# Patient Record
Sex: Female | Born: 1955 | Race: Black or African American | Hispanic: No
Health system: Southern US, Community
[De-identification: ages and names within clinical notes are randomized; demographics above are authoritative.]

---

## 1999-07-16 ENCOUNTER — Encounter: Admission: RE | Admit: 1999-07-16 | Discharge: 1999-07-16 | Payer: Self-pay | Admitting: Obstetrics and Gynecology

## 1999-07-16 ENCOUNTER — Encounter: Payer: Self-pay | Admitting: Obstetrics and Gynecology

## 2000-07-16 ENCOUNTER — Encounter: Payer: Self-pay | Admitting: Obstetrics and Gynecology

## 2000-07-16 ENCOUNTER — Encounter: Admission: RE | Admit: 2000-07-16 | Discharge: 2000-07-16 | Payer: Self-pay | Admitting: Obstetrics and Gynecology

## 2001-07-19 ENCOUNTER — Encounter: Admission: RE | Admit: 2001-07-19 | Discharge: 2001-07-19 | Payer: Self-pay | Admitting: Obstetrics and Gynecology

## 2001-07-19 ENCOUNTER — Encounter: Payer: Self-pay | Admitting: Obstetrics and Gynecology

## 2001-07-21 ENCOUNTER — Other Ambulatory Visit: Admission: RE | Admit: 2001-07-21 | Discharge: 2001-07-21 | Payer: Self-pay | Admitting: Obstetrics and Gynecology

## 2002-07-21 ENCOUNTER — Encounter: Admission: RE | Admit: 2002-07-21 | Discharge: 2002-07-21 | Payer: Self-pay | Admitting: Obstetrics and Gynecology

## 2002-07-21 ENCOUNTER — Encounter: Payer: Self-pay | Admitting: Obstetrics and Gynecology

## 2002-07-27 ENCOUNTER — Other Ambulatory Visit: Admission: RE | Admit: 2002-07-27 | Discharge: 2002-07-27 | Payer: Self-pay | Admitting: Obstetrics and Gynecology

## 2002-07-28 ENCOUNTER — Other Ambulatory Visit: Admission: RE | Admit: 2002-07-28 | Discharge: 2002-07-28 | Payer: Self-pay | Admitting: Obstetrics and Gynecology

## 2002-12-07 ENCOUNTER — Other Ambulatory Visit: Admission: RE | Admit: 2002-12-07 | Discharge: 2002-12-07 | Payer: Self-pay | Admitting: Obstetrics and Gynecology

## 2003-08-03 ENCOUNTER — Other Ambulatory Visit: Admission: RE | Admit: 2003-08-03 | Discharge: 2003-08-03 | Payer: Self-pay | Admitting: Obstetrics and Gynecology

## 2003-09-18 ENCOUNTER — Encounter: Admission: RE | Admit: 2003-09-18 | Discharge: 2003-09-18 | Payer: Self-pay | Admitting: Obstetrics and Gynecology

## 2004-02-01 ENCOUNTER — Other Ambulatory Visit: Admission: RE | Admit: 2004-02-01 | Discharge: 2004-02-01 | Payer: Self-pay | Admitting: Obstetrics and Gynecology

## 2004-08-06 ENCOUNTER — Other Ambulatory Visit: Admission: RE | Admit: 2004-08-06 | Discharge: 2004-08-06 | Payer: Self-pay | Admitting: Obstetrics and Gynecology

## 2004-09-30 ENCOUNTER — Encounter: Admission: RE | Admit: 2004-09-30 | Discharge: 2004-09-30 | Payer: Self-pay | Admitting: Obstetrics and Gynecology

## 2005-10-02 ENCOUNTER — Encounter: Admission: RE | Admit: 2005-10-02 | Discharge: 2005-10-02 | Payer: Self-pay | Admitting: Obstetrics and Gynecology

## 2005-12-02 ENCOUNTER — Encounter: Admission: RE | Admit: 2005-12-02 | Discharge: 2005-12-02 | Payer: Self-pay | Admitting: Obstetrics and Gynecology

## 2006-10-05 ENCOUNTER — Encounter: Admission: RE | Admit: 2006-10-05 | Discharge: 2006-10-05 | Payer: Self-pay | Admitting: Obstetrics and Gynecology

## 2007-10-06 ENCOUNTER — Encounter: Admission: RE | Admit: 2007-10-06 | Discharge: 2007-10-06 | Payer: Self-pay | Admitting: Obstetrics and Gynecology

## 2008-10-09 ENCOUNTER — Encounter: Admission: RE | Admit: 2008-10-09 | Discharge: 2008-10-09 | Payer: Self-pay | Admitting: Obstetrics and Gynecology

## 2009-10-15 ENCOUNTER — Encounter: Admission: RE | Admit: 2009-10-15 | Discharge: 2009-10-15 | Payer: Self-pay | Admitting: Obstetrics and Gynecology

## 2010-09-17 ENCOUNTER — Other Ambulatory Visit: Payer: Self-pay | Admitting: Obstetrics and Gynecology

## 2010-09-17 DIAGNOSIS — Z1231 Encounter for screening mammogram for malignant neoplasm of breast: Secondary | ICD-10-CM

## 2010-10-21 ENCOUNTER — Ambulatory Visit: Payer: Self-pay

## 2010-10-21 ENCOUNTER — Ambulatory Visit
Admission: RE | Admit: 2010-10-21 | Discharge: 2010-10-21 | Disposition: A | Discharge status: Home / Self Care | Payer: Managed Care, Other (non HMO) | Source: Ambulatory Visit | Attending: Obstetrics and Gynecology | Admitting: Obstetrics and Gynecology

## 2010-10-21 DIAGNOSIS — Z1231 Encounter for screening mammogram for malignant neoplasm of breast: Secondary | ICD-10-CM

## 2011-09-18 ENCOUNTER — Other Ambulatory Visit: Payer: Self-pay | Admitting: Obstetrics and Gynecology

## 2011-09-18 DIAGNOSIS — Z1231 Encounter for screening mammogram for malignant neoplasm of breast: Secondary | ICD-10-CM

## 2011-10-23 ENCOUNTER — Ambulatory Visit
Admission: RE | Admit: 2011-10-23 | Discharge: 2011-10-23 | Disposition: A | Discharge status: Home / Self Care | Payer: Managed Care, Other (non HMO) | Source: Ambulatory Visit | Attending: Obstetrics and Gynecology | Admitting: Obstetrics and Gynecology

## 2011-10-23 DIAGNOSIS — Z1231 Encounter for screening mammogram for malignant neoplasm of breast: Secondary | ICD-10-CM

## 2012-09-14 ENCOUNTER — Other Ambulatory Visit: Payer: Self-pay

## 2012-09-14 ENCOUNTER — Other Ambulatory Visit: Payer: Self-pay | Admitting: Obstetrics and Gynecology

## 2012-09-14 DIAGNOSIS — Z1231 Encounter for screening mammogram for malignant neoplasm of breast: Secondary | ICD-10-CM

## 2012-10-25 ENCOUNTER — Ambulatory Visit: Payer: Managed Care, Other (non HMO)

## 2012-11-02 ENCOUNTER — Ambulatory Visit
Admission: RE | Admit: 2012-11-02 | Discharge: 2012-11-02 | Disposition: A | Discharge status: Home / Self Care | Payer: Managed Care, Other (non HMO) | Source: Ambulatory Visit

## 2012-11-02 DIAGNOSIS — Z1231 Encounter for screening mammogram for malignant neoplasm of breast: Secondary | ICD-10-CM

## 2013-09-27 ENCOUNTER — Other Ambulatory Visit: Payer: Self-pay

## 2013-09-27 DIAGNOSIS — Z1231 Encounter for screening mammogram for malignant neoplasm of breast: Secondary | ICD-10-CM

## 2013-11-03 ENCOUNTER — Encounter (INDEPENDENT_AMBULATORY_CARE_PROVIDER_SITE_OTHER): Payer: Self-pay

## 2013-11-03 ENCOUNTER — Ambulatory Visit
Admission: RE | Admit: 2013-11-03 | Discharge: 2013-11-03 | Disposition: A | Discharge status: Home / Self Care | Payer: Managed Care, Other (non HMO) | Source: Ambulatory Visit

## 2013-11-03 DIAGNOSIS — Z1231 Encounter for screening mammogram for malignant neoplasm of breast: Secondary | ICD-10-CM

## 2014-10-03 ENCOUNTER — Other Ambulatory Visit: Payer: Self-pay

## 2014-10-03 DIAGNOSIS — Z1231 Encounter for screening mammogram for malignant neoplasm of breast: Secondary | ICD-10-CM

## 2014-11-06 ENCOUNTER — Ambulatory Visit: Payer: Managed Care, Other (non HMO)

## 2014-11-06 ENCOUNTER — Ambulatory Visit
Admission: RE | Admit: 2014-11-06 | Discharge: 2014-11-06 | Disposition: A | Discharge status: Home / Self Care | Payer: BLUE CROSS/BLUE SHIELD | Source: Ambulatory Visit

## 2014-11-06 DIAGNOSIS — Z1231 Encounter for screening mammogram for malignant neoplasm of breast: Secondary | ICD-10-CM

## 2015-02-03 ENCOUNTER — Other Ambulatory Visit: Payer: Self-pay | Admitting: Obstetrics and Gynecology

## 2015-02-03 DIAGNOSIS — N959 Unspecified menopausal and perimenopausal disorder: Secondary | ICD-10-CM

## 2015-03-12 ENCOUNTER — Ambulatory Visit
Admission: RE | Admit: 2015-03-12 | Discharge: 2015-03-12 | Disposition: A | Discharge status: Home / Self Care | Payer: BLUE CROSS/BLUE SHIELD | Source: Ambulatory Visit | Attending: Obstetrics and Gynecology | Admitting: Obstetrics and Gynecology

## 2015-03-12 DIAGNOSIS — N959 Unspecified menopausal and perimenopausal disorder: Secondary | ICD-10-CM

## 2015-10-09 ENCOUNTER — Other Ambulatory Visit: Payer: Self-pay | Admitting: Obstetrics and Gynecology

## 2015-10-09 DIAGNOSIS — Z1231 Encounter for screening mammogram for malignant neoplasm of breast: Secondary | ICD-10-CM

## 2015-11-07 ENCOUNTER — Ambulatory Visit: Payer: BLUE CROSS/BLUE SHIELD

## 2015-11-08 ENCOUNTER — Ambulatory Visit
Admission: RE | Admit: 2015-11-08 | Discharge: 2015-11-08 | Disposition: A | Discharge status: Home / Self Care | Payer: BLUE CROSS/BLUE SHIELD | Source: Ambulatory Visit | Attending: Obstetrics and Gynecology | Admitting: Obstetrics and Gynecology

## 2015-11-08 DIAGNOSIS — Z1231 Encounter for screening mammogram for malignant neoplasm of breast: Secondary | ICD-10-CM

## 2016-03-18 ENCOUNTER — Other Ambulatory Visit: Payer: Self-pay | Admitting: Family Medicine

## 2016-03-18 DIAGNOSIS — Z1211 Encounter for screening for malignant neoplasm of colon: Secondary | ICD-10-CM

## 2016-03-28 ENCOUNTER — Ambulatory Visit
Admission: RE | Admit: 2016-03-28 | Discharge: 2016-03-28 | Disposition: A | Discharge status: Home / Self Care | Payer: BLUE CROSS/BLUE SHIELD | Source: Ambulatory Visit | Attending: Family Medicine | Admitting: Family Medicine

## 2016-03-28 DIAGNOSIS — Z1211 Encounter for screening for malignant neoplasm of colon: Secondary | ICD-10-CM

## 2016-09-26 ENCOUNTER — Other Ambulatory Visit: Payer: Self-pay | Admitting: Obstetrics and Gynecology

## 2016-09-26 DIAGNOSIS — Z1231 Encounter for screening mammogram for malignant neoplasm of breast: Secondary | ICD-10-CM

## 2016-11-10 ENCOUNTER — Ambulatory Visit
Admission: RE | Admit: 2016-11-10 | Discharge: 2016-11-10 | Disposition: A | Discharge status: Home / Self Care | Payer: No Typology Code available for payment source | Source: Ambulatory Visit | Attending: Obstetrics and Gynecology | Admitting: Obstetrics and Gynecology

## 2016-11-10 ENCOUNTER — Other Ambulatory Visit: Payer: Self-pay | Admitting: Obstetrics and Gynecology

## 2016-11-10 DIAGNOSIS — Z1231 Encounter for screening mammogram for malignant neoplasm of breast: Secondary | ICD-10-CM

## 2017-09-16 DIAGNOSIS — I10 Essential (primary) hypertension: Secondary | ICD-10-CM | POA: Diagnosis not present

## 2017-09-16 DIAGNOSIS — E119 Type 2 diabetes mellitus without complications: Secondary | ICD-10-CM | POA: Diagnosis not present

## 2017-10-05 ENCOUNTER — Other Ambulatory Visit: Payer: Self-pay | Admitting: Internal Medicine

## 2017-10-05 DIAGNOSIS — Z1231 Encounter for screening mammogram for malignant neoplasm of breast: Secondary | ICD-10-CM

## 2017-11-12 ENCOUNTER — Ambulatory Visit
Admission: RE | Admit: 2017-11-12 | Discharge: 2017-11-12 | Disposition: A | Discharge status: Home / Self Care | Payer: 59 | Source: Ambulatory Visit | Attending: Internal Medicine | Admitting: Internal Medicine

## 2017-11-12 DIAGNOSIS — Z1231 Encounter for screening mammogram for malignant neoplasm of breast: Secondary | ICD-10-CM | POA: Diagnosis not present

## 2018-02-02 DIAGNOSIS — E119 Type 2 diabetes mellitus without complications: Secondary | ICD-10-CM | POA: Diagnosis not present

## 2018-02-23 DIAGNOSIS — Z01419 Encounter for gynecological examination (general) (routine) without abnormal findings: Secondary | ICD-10-CM | POA: Diagnosis not present

## 2018-02-23 DIAGNOSIS — Z124 Encounter for screening for malignant neoplasm of cervix: Secondary | ICD-10-CM | POA: Diagnosis not present

## 2018-04-08 DIAGNOSIS — E1169 Type 2 diabetes mellitus with other specified complication: Secondary | ICD-10-CM | POA: Diagnosis not present

## 2018-04-08 DIAGNOSIS — E2839 Other primary ovarian failure: Secondary | ICD-10-CM | POA: Diagnosis not present

## 2018-04-08 DIAGNOSIS — Z0000 Encounter for general adult medical examination without abnormal findings: Secondary | ICD-10-CM | POA: Diagnosis not present

## 2018-04-08 DIAGNOSIS — Z1389 Encounter for screening for other disorder: Secondary | ICD-10-CM | POA: Diagnosis not present

## 2018-04-08 DIAGNOSIS — I10 Essential (primary) hypertension: Secondary | ICD-10-CM | POA: Diagnosis not present

## 2018-04-14 ENCOUNTER — Other Ambulatory Visit: Payer: Self-pay | Admitting: Family Medicine

## 2018-04-14 DIAGNOSIS — E2839 Other primary ovarian failure: Secondary | ICD-10-CM

## 2018-05-07 DIAGNOSIS — Z0100 Encounter for examination of eyes and vision without abnormal findings: Secondary | ICD-10-CM | POA: Diagnosis not present

## 2018-06-09 ENCOUNTER — Other Ambulatory Visit: Payer: 59

## 2018-06-11 ENCOUNTER — Other Ambulatory Visit: Payer: 59

## 2018-08-16 ENCOUNTER — Other Ambulatory Visit: Payer: Self-pay

## 2018-08-25 ENCOUNTER — Other Ambulatory Visit: Payer: Self-pay | Admitting: *Deleted

## 2018-08-25 NOTE — Patient Outreach (Signed)
Late entry 07/02/18- Telephone call to patient for follow up on [place]. RN CM called pt x 2 on 06/25/18 and 07/02/18 with no answer to telephone and no option to leave voicemail.  RN CM mailed unsuccessful outreach letter to patient's home with 24 hour nurse line magnet and pamphlet.    PLAN Outreach pt in 2 months  Jacqlyn Larsen [facility], [place] Coordinator [phone]

## 2018-09-07 ENCOUNTER — Ambulatory Visit: Payer: Self-pay | Admitting: *Deleted

## 2018-09-22 ENCOUNTER — Other Ambulatory Visit: Payer: Self-pay | Admitting: *Deleted

## 2018-09-22 NOTE — Patient Outreach (Signed)
Case closed, pt enrolled in external program [employer]/ [facility].  RN CM faxed case closure letter to primary care provider.  Case closed  Jacqlyn Larsen [facility], [place] Coordinator [phone]

## 2018-10-05 ENCOUNTER — Other Ambulatory Visit: Payer: Self-pay | Admitting: Internal Medicine

## 2018-10-05 DIAGNOSIS — I10 Essential (primary) hypertension: Secondary | ICD-10-CM | POA: Diagnosis not present

## 2018-10-05 DIAGNOSIS — Z9289 Personal history of other medical treatment: Secondary | ICD-10-CM

## 2018-10-05 DIAGNOSIS — E1169 Type 2 diabetes mellitus with other specified complication: Secondary | ICD-10-CM | POA: Diagnosis not present

## 2018-10-06 ENCOUNTER — Ambulatory Visit
Admission: RE | Admit: 2018-10-06 | Discharge: 2018-10-06 | Disposition: A | Discharge status: Home / Self Care | Payer: PPO | Source: Ambulatory Visit | Attending: Family Medicine | Admitting: Family Medicine

## 2018-10-06 ENCOUNTER — Other Ambulatory Visit: Payer: Self-pay

## 2018-10-06 DIAGNOSIS — M8589 Other specified disorders of bone density and structure, multiple sites: Secondary | ICD-10-CM | POA: Diagnosis not present

## 2018-10-06 DIAGNOSIS — E2839 Other primary ovarian failure: Secondary | ICD-10-CM

## 2018-10-06 DIAGNOSIS — Z780 Asymptomatic menopausal state: Secondary | ICD-10-CM | POA: Diagnosis not present

## 2018-10-26 DIAGNOSIS — E1169 Type 2 diabetes mellitus with other specified complication: Secondary | ICD-10-CM | POA: Diagnosis not present

## 2018-11-16 ENCOUNTER — Ambulatory Visit
Admission: RE | Admit: 2018-11-16 | Discharge: 2018-11-16 | Disposition: A | Discharge status: Home / Self Care | Payer: PPO | Source: Ambulatory Visit | Attending: Internal Medicine | Admitting: Internal Medicine

## 2018-11-16 ENCOUNTER — Other Ambulatory Visit: Payer: Self-pay

## 2018-11-16 DIAGNOSIS — Z9289 Personal history of other medical treatment: Secondary | ICD-10-CM

## 2018-11-16 DIAGNOSIS — Z1231 Encounter for screening mammogram for malignant neoplasm of breast: Secondary | ICD-10-CM | POA: Diagnosis not present

## 2018-12-14 DIAGNOSIS — D225 Melanocytic nevi of trunk: Secondary | ICD-10-CM | POA: Diagnosis not present

## 2018-12-14 DIAGNOSIS — D1801 Hemangioma of skin and subcutaneous tissue: Secondary | ICD-10-CM | POA: Diagnosis not present

## 2019-01-28 ENCOUNTER — Other Ambulatory Visit: Payer: Self-pay

## 2019-01-28 DIAGNOSIS — Z20822 Contact with and (suspected) exposure to covid-19: Secondary | ICD-10-CM

## 2019-01-29 LAB — NOVEL CORONAVIRUS, NAA: SARS-CoV-2, NAA: NOT DETECTED

## 2019-01-31 DIAGNOSIS — E119 Type 2 diabetes mellitus without complications: Secondary | ICD-10-CM | POA: Diagnosis not present

## 2019-01-31 DIAGNOSIS — H52223 Regular astigmatism, bilateral: Secondary | ICD-10-CM | POA: Diagnosis not present

## 2019-01-31 DIAGNOSIS — H25093 Other age-related incipient cataract, bilateral: Secondary | ICD-10-CM | POA: Diagnosis not present

## 2019-01-31 DIAGNOSIS — H524 Presbyopia: Secondary | ICD-10-CM | POA: Diagnosis not present

## 2019-01-31 DIAGNOSIS — H5201 Hypermetropia, right eye: Secondary | ICD-10-CM | POA: Diagnosis not present

## 2019-01-31 DIAGNOSIS — I10 Essential (primary) hypertension: Secondary | ICD-10-CM | POA: Diagnosis not present

## 2019-03-07 DIAGNOSIS — Z01419 Encounter for gynecological examination (general) (routine) without abnormal findings: Secondary | ICD-10-CM | POA: Diagnosis not present

## 2019-03-31 ENCOUNTER — Ambulatory Visit: Payer: PPO | Attending: Internal Medicine

## 2019-03-31 DIAGNOSIS — Z20822 Contact with and (suspected) exposure to covid-19: Secondary | ICD-10-CM

## 2019-03-31 DIAGNOSIS — Z20828 Contact with and (suspected) exposure to other viral communicable diseases: Secondary | ICD-10-CM | POA: Diagnosis not present

## 2019-04-01 LAB — NOVEL CORONAVIRUS, NAA: SARS-CoV-2, NAA: NOT DETECTED

## 2019-04-26 DIAGNOSIS — Z1389 Encounter for screening for other disorder: Secondary | ICD-10-CM | POA: Diagnosis not present

## 2019-04-26 DIAGNOSIS — E1169 Type 2 diabetes mellitus with other specified complication: Secondary | ICD-10-CM | POA: Diagnosis not present

## 2019-04-26 DIAGNOSIS — Z0000 Encounter for general adult medical examination without abnormal findings: Secondary | ICD-10-CM | POA: Diagnosis not present

## 2019-04-26 DIAGNOSIS — I10 Essential (primary) hypertension: Secondary | ICD-10-CM | POA: Diagnosis not present

## 2019-10-04 ENCOUNTER — Other Ambulatory Visit: Payer: Self-pay | Admitting: Family Medicine

## 2019-10-04 DIAGNOSIS — Z1231 Encounter for screening mammogram for malignant neoplasm of breast: Secondary | ICD-10-CM

## 2019-10-24 DIAGNOSIS — I10 Essential (primary) hypertension: Secondary | ICD-10-CM | POA: Diagnosis not present

## 2019-10-24 DIAGNOSIS — E1169 Type 2 diabetes mellitus with other specified complication: Secondary | ICD-10-CM | POA: Diagnosis not present

## 2019-11-17 ENCOUNTER — Ambulatory Visit
Admission: RE | Admit: 2019-11-17 | Discharge: 2019-11-17 | Disposition: A | Discharge status: Home / Self Care | Payer: PPO | Source: Ambulatory Visit | Attending: Family Medicine | Admitting: Family Medicine

## 2019-11-17 ENCOUNTER — Other Ambulatory Visit: Payer: Self-pay

## 2019-11-17 DIAGNOSIS — Z1231 Encounter for screening mammogram for malignant neoplasm of breast: Secondary | ICD-10-CM

## 2020-02-09 DIAGNOSIS — E119 Type 2 diabetes mellitus without complications: Secondary | ICD-10-CM | POA: Diagnosis not present

## 2020-02-09 DIAGNOSIS — H25093 Other age-related incipient cataract, bilateral: Secondary | ICD-10-CM | POA: Diagnosis not present

## 2020-02-09 DIAGNOSIS — H5201 Hypermetropia, right eye: Secondary | ICD-10-CM | POA: Diagnosis not present

## 2020-02-09 DIAGNOSIS — H52223 Regular astigmatism, bilateral: Secondary | ICD-10-CM | POA: Diagnosis not present

## 2020-02-09 DIAGNOSIS — H524 Presbyopia: Secondary | ICD-10-CM | POA: Diagnosis not present

## 2020-02-09 DIAGNOSIS — I10 Essential (primary) hypertension: Secondary | ICD-10-CM | POA: Diagnosis not present

## 2020-05-08 DIAGNOSIS — Z0000 Encounter for general adult medical examination without abnormal findings: Secondary | ICD-10-CM | POA: Diagnosis not present

## 2020-05-08 DIAGNOSIS — Z1159 Encounter for screening for other viral diseases: Secondary | ICD-10-CM | POA: Diagnosis not present

## 2020-05-08 DIAGNOSIS — Z1389 Encounter for screening for other disorder: Secondary | ICD-10-CM | POA: Diagnosis not present

## 2020-05-08 DIAGNOSIS — E1169 Type 2 diabetes mellitus with other specified complication: Secondary | ICD-10-CM | POA: Diagnosis not present

## 2020-05-08 DIAGNOSIS — I10 Essential (primary) hypertension: Secondary | ICD-10-CM | POA: Diagnosis not present

## 2020-10-04 ENCOUNTER — Other Ambulatory Visit: Payer: Self-pay | Admitting: Family Medicine

## 2020-10-04 DIAGNOSIS — Z1231 Encounter for screening mammogram for malignant neoplasm of breast: Secondary | ICD-10-CM

## 2020-11-07 DIAGNOSIS — I10 Essential (primary) hypertension: Secondary | ICD-10-CM | POA: Diagnosis not present

## 2020-11-07 DIAGNOSIS — E1169 Type 2 diabetes mellitus with other specified complication: Secondary | ICD-10-CM | POA: Diagnosis not present

## 2020-11-29 ENCOUNTER — Other Ambulatory Visit: Payer: Self-pay

## 2020-11-29 ENCOUNTER — Ambulatory Visit
Admission: RE | Admit: 2020-11-29 | Discharge: 2020-11-29 | Disposition: A | Discharge status: Home / Self Care | Payer: PPO | Source: Ambulatory Visit | Attending: Family Medicine | Admitting: Family Medicine

## 2020-11-29 DIAGNOSIS — Z1231 Encounter for screening mammogram for malignant neoplasm of breast: Secondary | ICD-10-CM

## 2020-12-04 ENCOUNTER — Other Ambulatory Visit: Payer: Self-pay | Admitting: Family Medicine

## 2020-12-04 DIAGNOSIS — R928 Other abnormal and inconclusive findings on diagnostic imaging of breast: Secondary | ICD-10-CM

## 2020-12-18 ENCOUNTER — Ambulatory Visit
Admission: RE | Admit: 2020-12-18 | Discharge: 2020-12-18 | Disposition: A | Discharge status: Home / Self Care | Payer: PPO | Source: Ambulatory Visit | Attending: Family Medicine | Admitting: Family Medicine

## 2020-12-18 ENCOUNTER — Other Ambulatory Visit: Payer: Self-pay

## 2020-12-18 ENCOUNTER — Ambulatory Visit: Payer: PPO

## 2020-12-18 DIAGNOSIS — R928 Other abnormal and inconclusive findings on diagnostic imaging of breast: Secondary | ICD-10-CM

## 2020-12-18 DIAGNOSIS — R922 Inconclusive mammogram: Secondary | ICD-10-CM | POA: Diagnosis not present

## 2021-03-14 DIAGNOSIS — Z01419 Encounter for gynecological examination (general) (routine) without abnormal findings: Secondary | ICD-10-CM | POA: Diagnosis not present

## 2021-05-09 DIAGNOSIS — H18413 Arcus senilis, bilateral: Secondary | ICD-10-CM | POA: Diagnosis not present

## 2021-05-09 DIAGNOSIS — H25043 Posterior subcapsular polar age-related cataract, bilateral: Secondary | ICD-10-CM | POA: Diagnosis not present

## 2021-05-09 DIAGNOSIS — H2513 Age-related nuclear cataract, bilateral: Secondary | ICD-10-CM | POA: Diagnosis not present

## 2021-05-09 DIAGNOSIS — H25013 Cortical age-related cataract, bilateral: Secondary | ICD-10-CM | POA: Diagnosis not present

## 2021-05-27 DIAGNOSIS — I10 Essential (primary) hypertension: Secondary | ICD-10-CM | POA: Diagnosis not present

## 2021-05-27 DIAGNOSIS — Z1389 Encounter for screening for other disorder: Secondary | ICD-10-CM | POA: Diagnosis not present

## 2021-05-27 DIAGNOSIS — Z6828 Body mass index (BMI) 28.0-28.9, adult: Secondary | ICD-10-CM | POA: Diagnosis not present

## 2021-05-27 DIAGNOSIS — E1169 Type 2 diabetes mellitus with other specified complication: Secondary | ICD-10-CM | POA: Diagnosis not present

## 2021-05-27 DIAGNOSIS — Z0000 Encounter for general adult medical examination without abnormal findings: Secondary | ICD-10-CM | POA: Diagnosis not present

## 2021-06-19 DIAGNOSIS — H2511 Age-related nuclear cataract, right eye: Secondary | ICD-10-CM | POA: Diagnosis not present

## 2021-06-20 DIAGNOSIS — H2512 Age-related nuclear cataract, left eye: Secondary | ICD-10-CM | POA: Diagnosis not present

## 2021-07-10 DIAGNOSIS — H2512 Age-related nuclear cataract, left eye: Secondary | ICD-10-CM | POA: Diagnosis not present

## 2021-10-14 ENCOUNTER — Other Ambulatory Visit: Payer: Self-pay | Admitting: Family Medicine

## 2021-10-14 DIAGNOSIS — Z1231 Encounter for screening mammogram for malignant neoplasm of breast: Secondary | ICD-10-CM

## 2021-11-13 DIAGNOSIS — I10 Essential (primary) hypertension: Secondary | ICD-10-CM | POA: Diagnosis not present

## 2021-11-13 DIAGNOSIS — E119 Type 2 diabetes mellitus without complications: Secondary | ICD-10-CM | POA: Diagnosis not present

## 2021-12-19 ENCOUNTER — Ambulatory Visit
Admission: RE | Admit: 2021-12-19 | Discharge: 2021-12-19 | Disposition: A | Discharge status: Home / Self Care | Payer: PPO | Source: Ambulatory Visit | Attending: Family Medicine | Admitting: Family Medicine

## 2021-12-19 DIAGNOSIS — Z1231 Encounter for screening mammogram for malignant neoplasm of breast: Secondary | ICD-10-CM

## 2022-02-07 IMAGING — MG DIGITAL SCREENING BILAT W/ TOMO W/ CAD
8 series · 8 of 24 positions shown · non-contrast
Comparison: Previous exam(s).

CLINICAL DATA: Screening.

EXAM:
DIGITAL SCREENING BILATERAL MAMMOGRAM WITH TOMO AND CAD

[R MLO synth-2D]
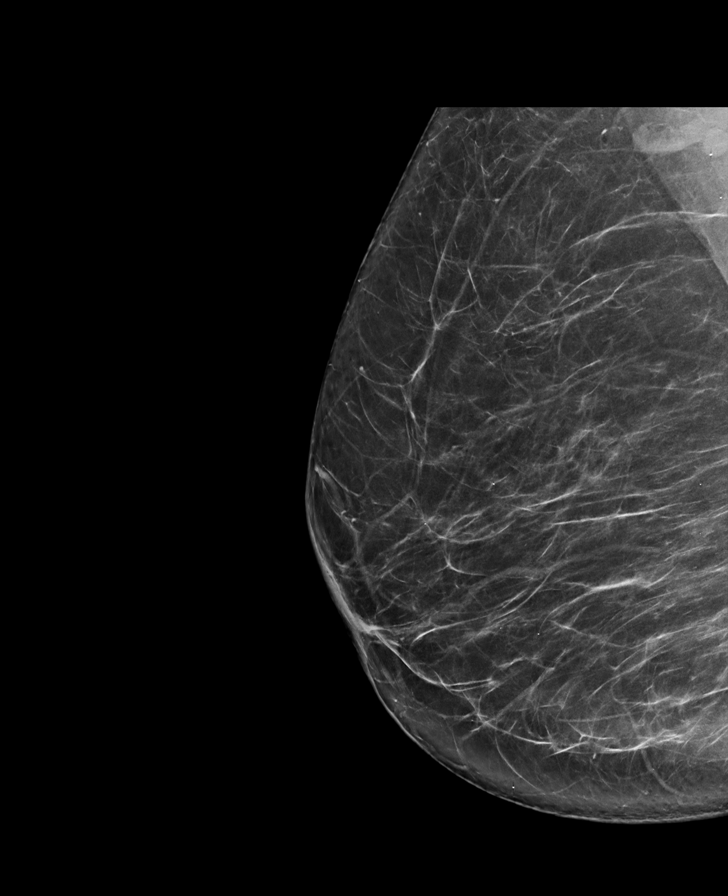

[L CC synth-2D]
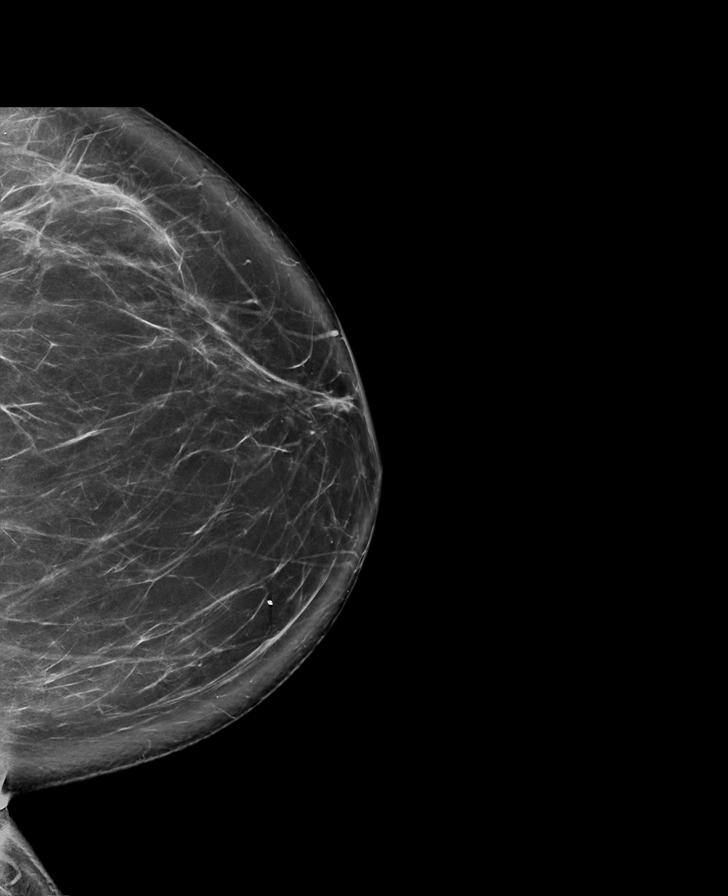

[L MLO synth-2D]
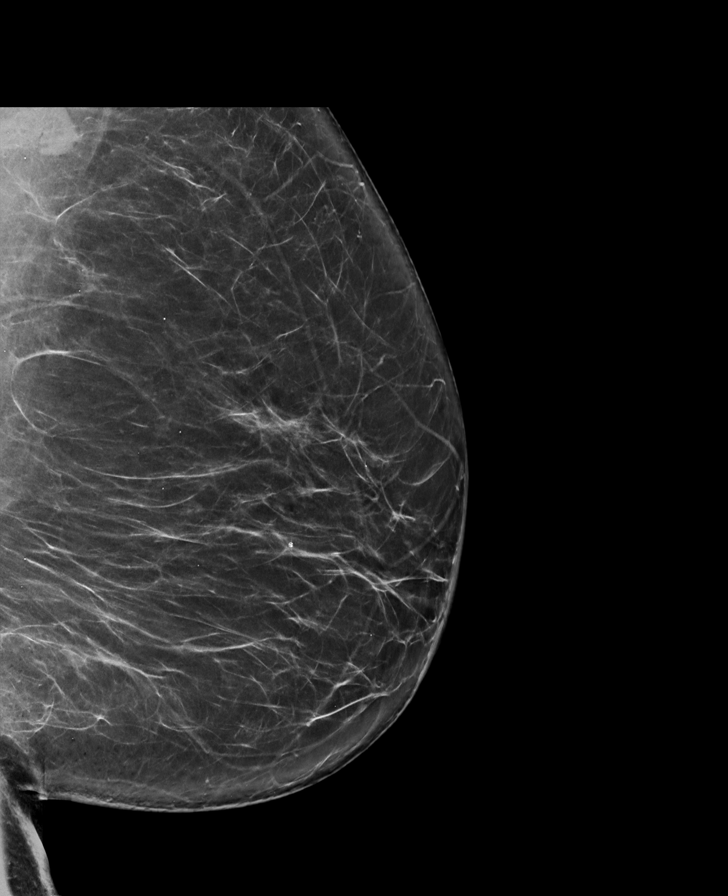

[R CC synth-2D]
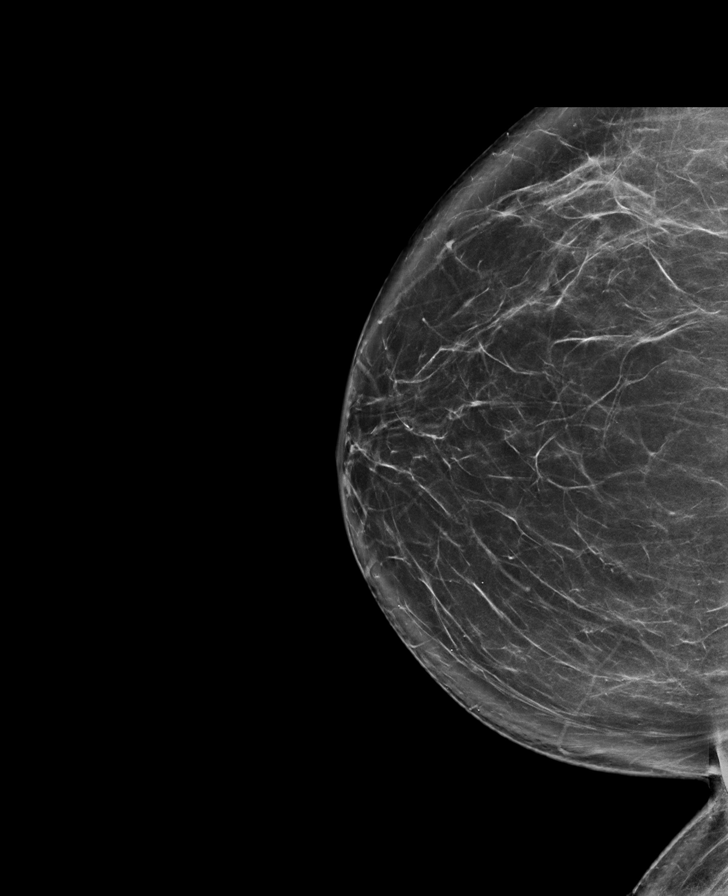

[L MLO tomo · tomo slice 46/91.0]
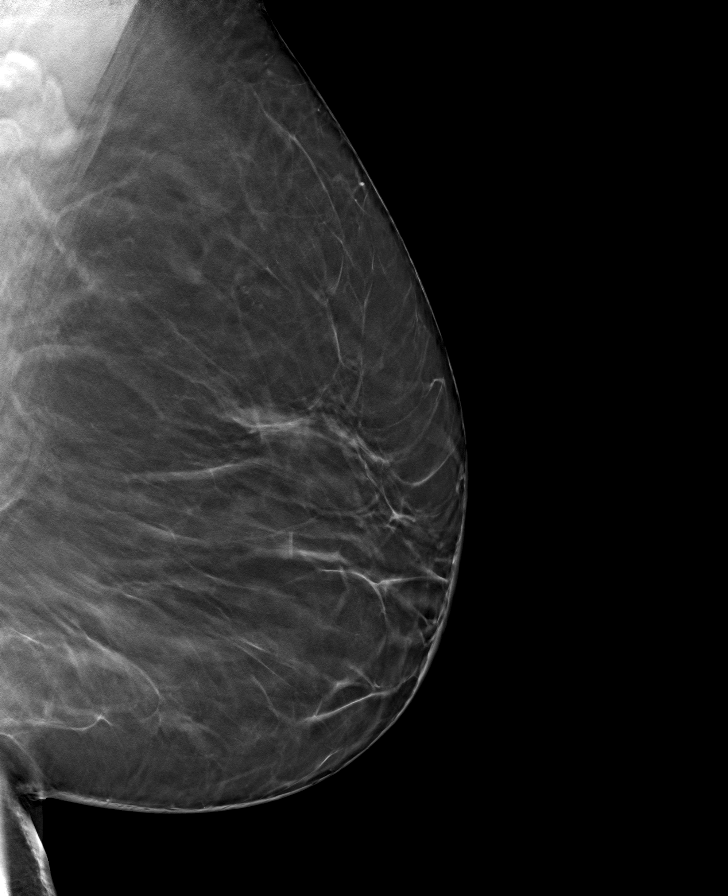

[R MLO tomo · tomo slice 43/86.0]
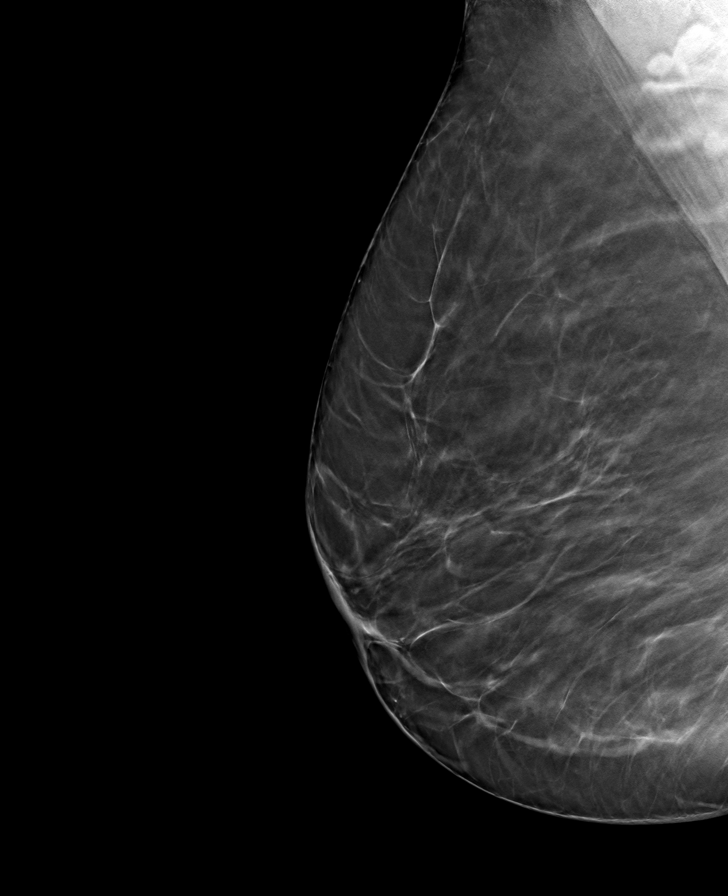

[R CC tomo · tomo slice 47/93.0]
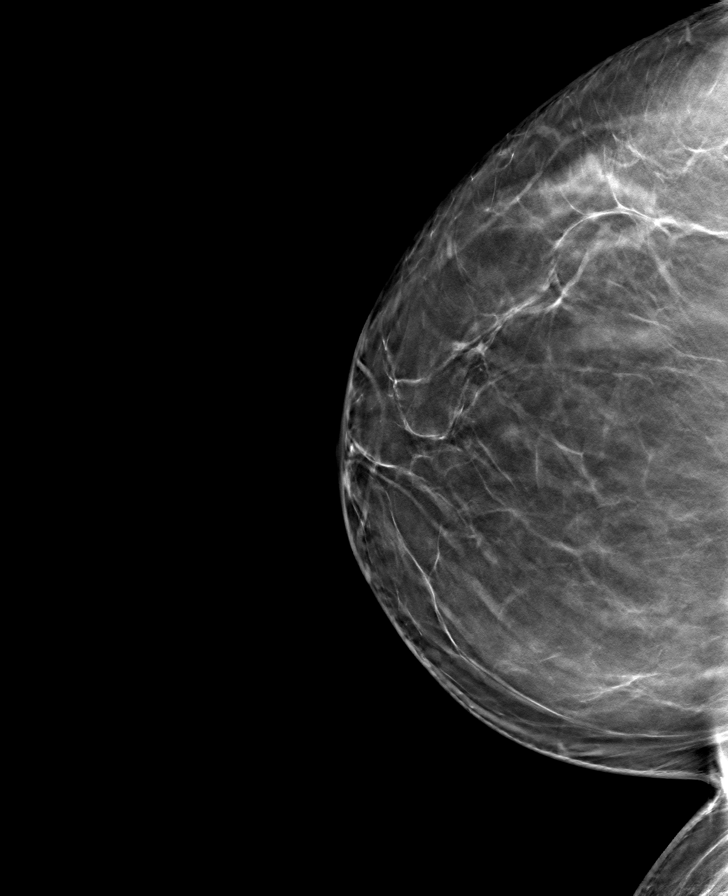

[L CC tomo · tomo slice 48/95.0]
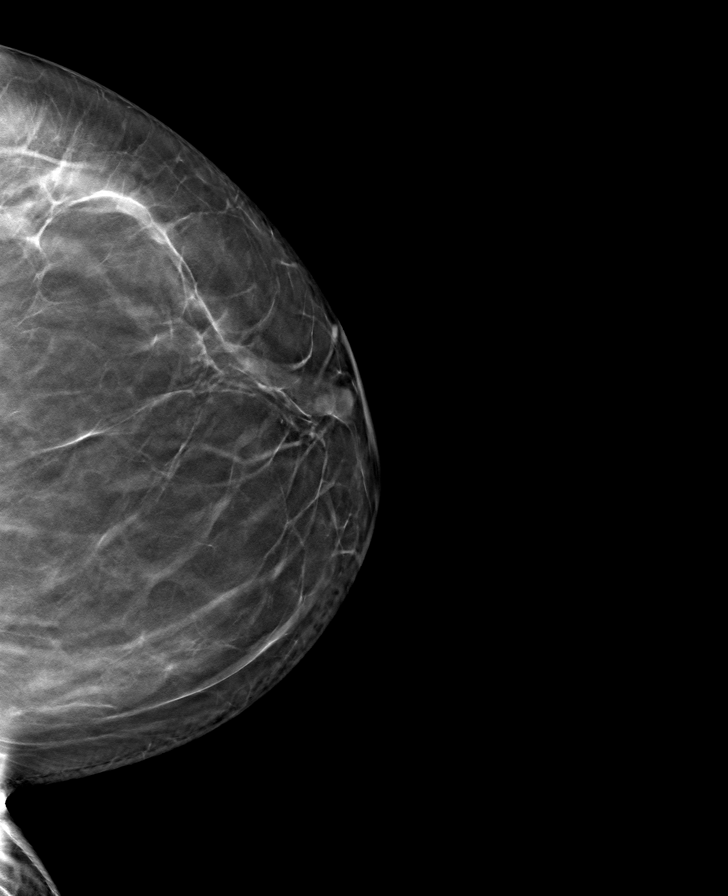

[8 of 24 positions shown; findings below may reference images not displayed]

ACR Breast Density Category b: There are scattered areas of
fibroglandular density.
FINDINGS: There are no findings suspicious for malignancy. Images were
processed with CAD.
IMPRESSION: No mammographic evidence of malignancy. A result letter of this
screening mammogram will be mailed directly to the patient.

RECOMMENDATION:
Screening mammogram in one year. (Code:[id number])

BI-RADS CATEGORY  1: Negative.

## 2022-06-26 DIAGNOSIS — Z1331 Encounter for screening for depression: Secondary | ICD-10-CM | POA: Diagnosis not present

## 2022-06-26 DIAGNOSIS — Z0000 Encounter for general adult medical examination without abnormal findings: Secondary | ICD-10-CM | POA: Diagnosis not present

## 2022-06-26 DIAGNOSIS — E119 Type 2 diabetes mellitus without complications: Secondary | ICD-10-CM | POA: Diagnosis not present

## 2022-06-26 DIAGNOSIS — E1169 Type 2 diabetes mellitus with other specified complication: Secondary | ICD-10-CM | POA: Diagnosis not present

## 2022-06-26 DIAGNOSIS — Z6828 Body mass index (BMI) 28.0-28.9, adult: Secondary | ICD-10-CM | POA: Diagnosis not present

## 2022-06-26 DIAGNOSIS — I10 Essential (primary) hypertension: Secondary | ICD-10-CM | POA: Diagnosis not present

## 2022-11-05 ENCOUNTER — Other Ambulatory Visit: Payer: Self-pay | Admitting: Family Medicine

## 2022-11-05 DIAGNOSIS — Z0000 Encounter for general adult medical examination without abnormal findings: Secondary | ICD-10-CM

## 2022-12-22 ENCOUNTER — Ambulatory Visit
Admission: RE | Admit: 2022-12-22 | Discharge: 2022-12-22 | Disposition: A | Discharge status: Home / Self Care | Payer: PPO | Source: Ambulatory Visit | Attending: Family Medicine | Admitting: Family Medicine

## 2022-12-22 DIAGNOSIS — Z1231 Encounter for screening mammogram for malignant neoplasm of breast: Secondary | ICD-10-CM | POA: Diagnosis not present

## 2022-12-22 DIAGNOSIS — Z0000 Encounter for general adult medical examination without abnormal findings: Secondary | ICD-10-CM

## 2022-12-29 DIAGNOSIS — E2839 Other primary ovarian failure: Secondary | ICD-10-CM | POA: Diagnosis not present

## 2022-12-29 DIAGNOSIS — E119 Type 2 diabetes mellitus without complications: Secondary | ICD-10-CM | POA: Diagnosis not present

## 2022-12-29 DIAGNOSIS — I10 Essential (primary) hypertension: Secondary | ICD-10-CM | POA: Diagnosis not present

## 2022-12-31 ENCOUNTER — Other Ambulatory Visit: Payer: Self-pay | Admitting: Family Medicine

## 2022-12-31 DIAGNOSIS — E2839 Other primary ovarian failure: Secondary | ICD-10-CM

## 2023-02-13 DIAGNOSIS — H1033 Unspecified acute conjunctivitis, bilateral: Secondary | ICD-10-CM | POA: Diagnosis not present

## 2023-02-13 DIAGNOSIS — Z6828 Body mass index (BMI) 28.0-28.9, adult: Secondary | ICD-10-CM | POA: Diagnosis not present

## 2023-02-13 DIAGNOSIS — R051 Acute cough: Secondary | ICD-10-CM | POA: Diagnosis not present

## 2023-03-11 IMAGING — MG DIGITAL DIAGNOSTIC BILAT W/ TOMO W/ CAD
8 series · 8 of 24 positions shown · non-contrast
Comparison: Previous exam(s).

CLINICAL DATA: 65-year-old female recalled from screening mammogram
dated 11/29/2020 for possible bilateral asymmetries.

EXAM:
DIGITAL DIAGNOSTIC BILATERAL MAMMOGRAM WITH TOMOSYNTHESIS AND CAD
TECHNIQUE: Bilateral digital diagnostic mammography and breast tomosynthesis
was performed. The images were evaluated with computer-aided
detection.

[L ML synth-2D]
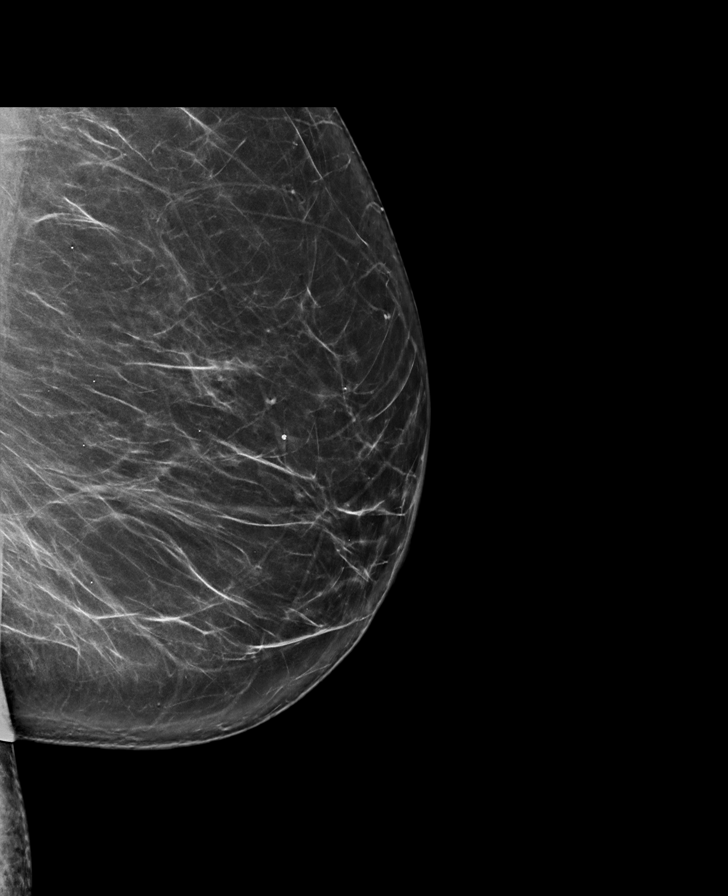

[L CC synth-2D]
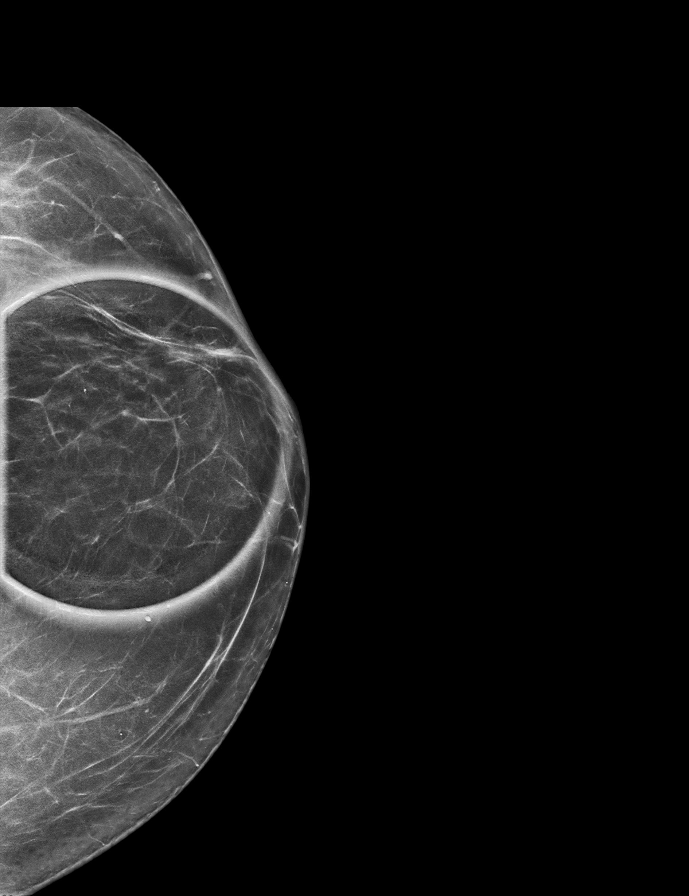

[R CC synth-2D]
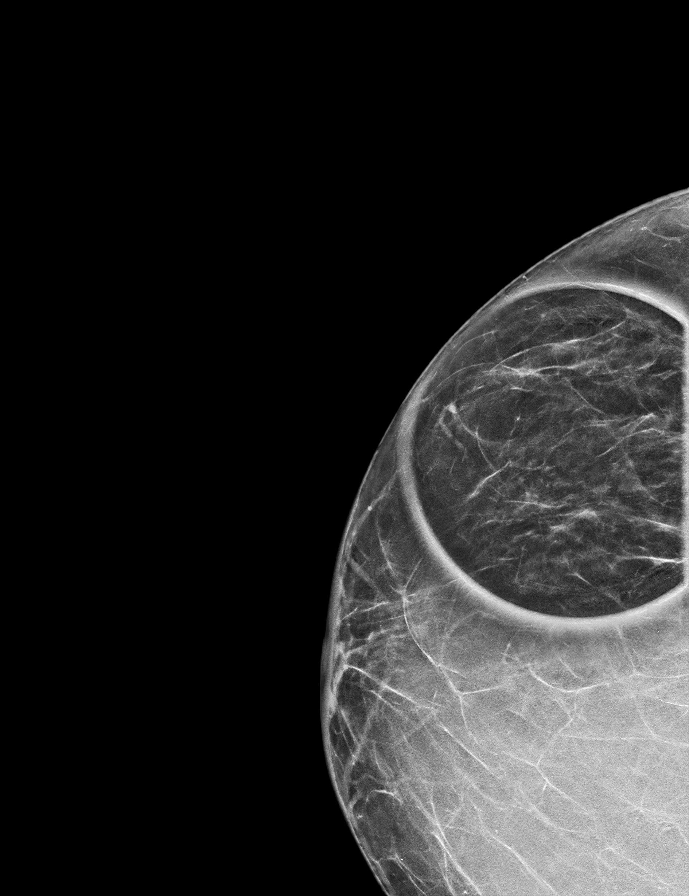

[R ML synth-2D]
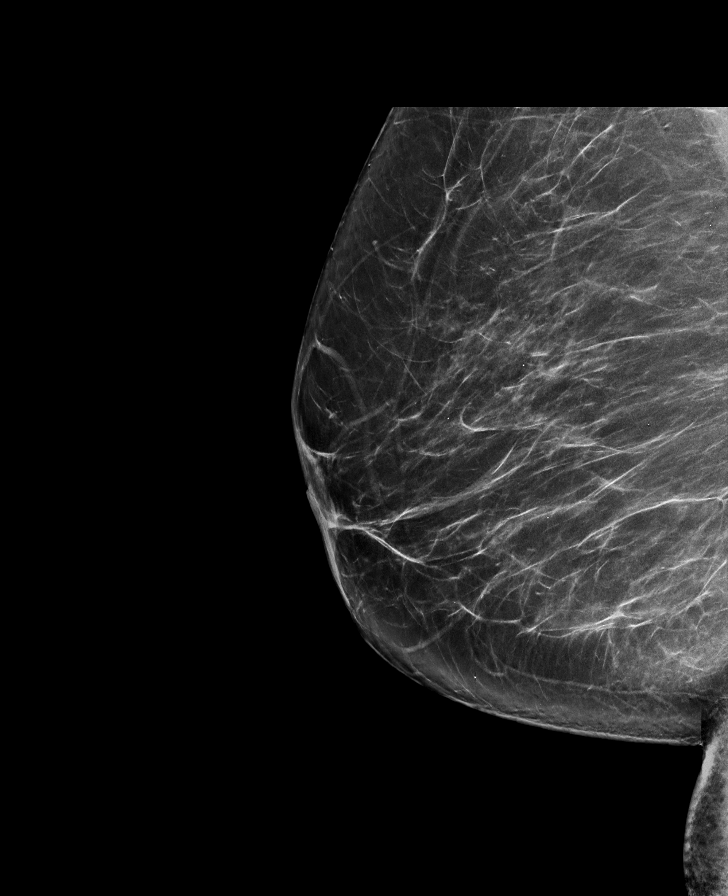

[L CC tomo · tomo slice 31/61.0]
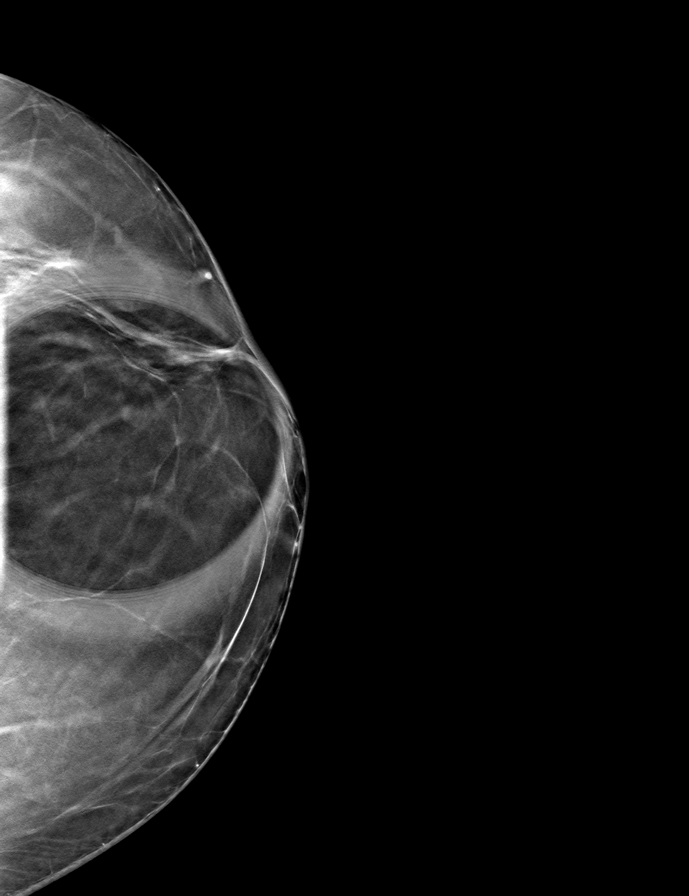

[R CC tomo · tomo slice 30/59.0]
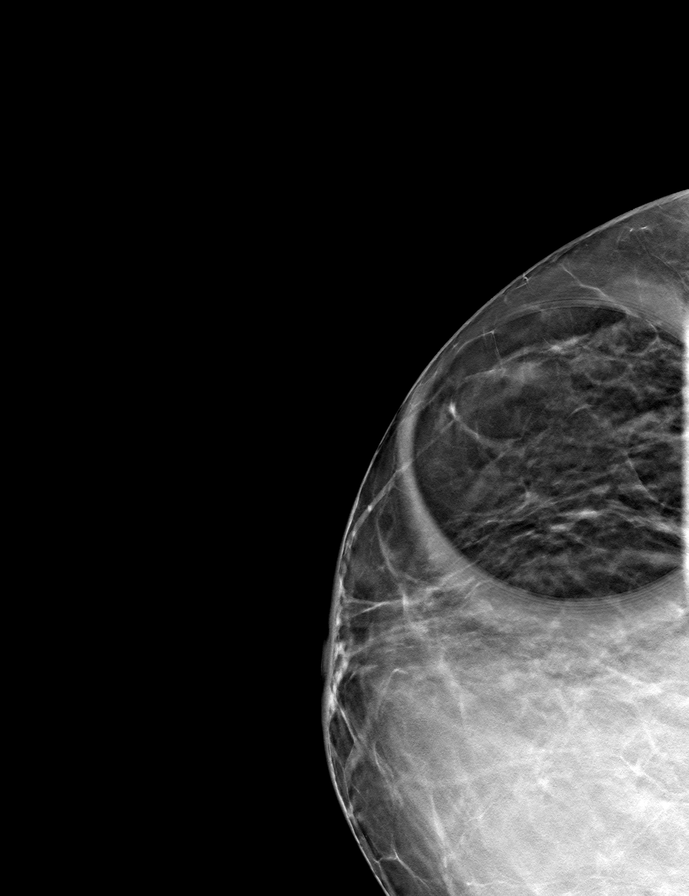

[L ML tomo · tomo slice 45/88.0]
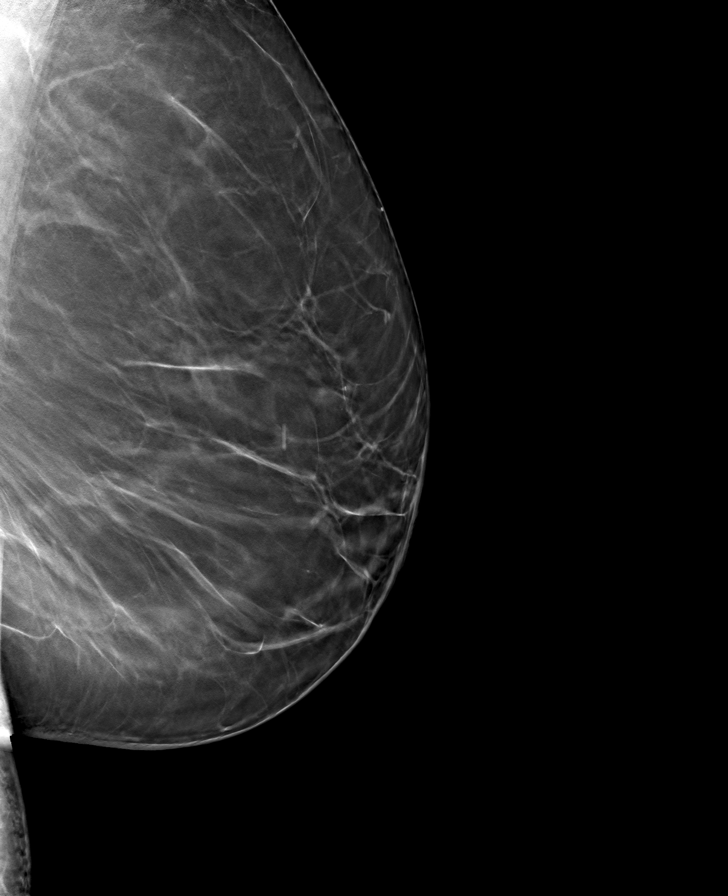

[R ML tomo · tomo slice 43/85.0]
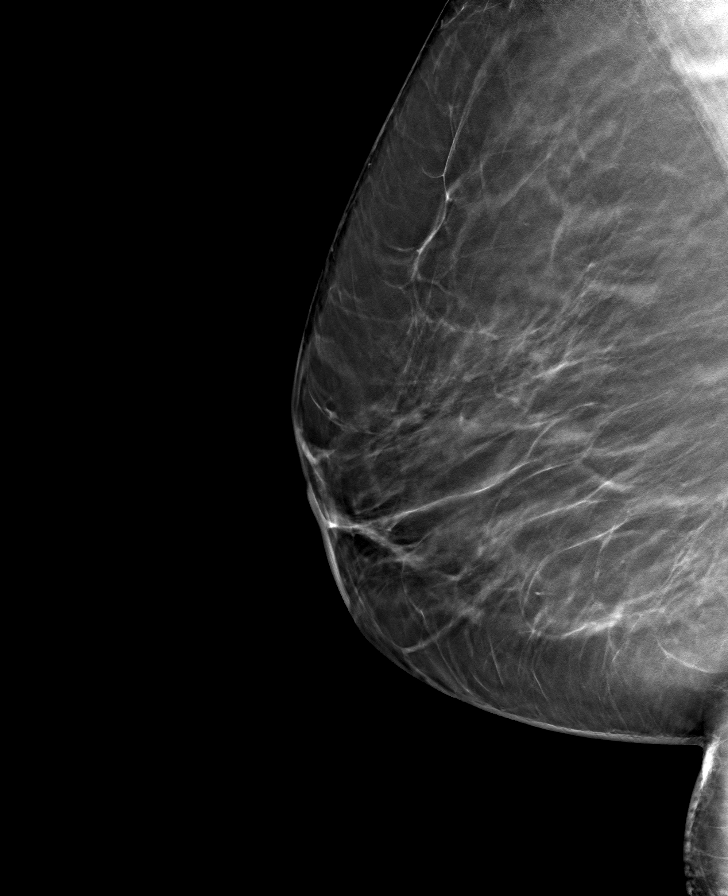

[8 of 24 positions shown; findings below may reference images not displayed]

ACR Breast Density Category b: There are scattered areas of
fibroglandular density.
FINDINGS: Previously described, possible asymmetries in the lateral right
breast at mid depth and subareolar left breast as seen on the cc
projections only both resolve into well dispersed fibroglandular
tissue on today's additional views. No suspicious findings are
identified.
IMPRESSION: No mammographic evidence of malignancy.

RECOMMENDATION:
Screening mammogram in one year.(Code:[id number])

I have discussed the findings and recommendations with the patient.
If applicable, a reminder letter will be sent to the patient
regarding the next appointment.

BI-RADS CATEGORY  1: Negative.

## 2023-03-16 DIAGNOSIS — Z01419 Encounter for gynecological examination (general) (routine) without abnormal findings: Secondary | ICD-10-CM | POA: Diagnosis not present

## 2023-06-03 ENCOUNTER — Other Ambulatory Visit: Payer: Self-pay | Admitting: Medical Genetics

## 2023-06-19 ENCOUNTER — Other Ambulatory Visit: Payer: Self-pay

## 2023-07-23 DIAGNOSIS — Z0000 Encounter for general adult medical examination without abnormal findings: Secondary | ICD-10-CM | POA: Diagnosis not present

## 2023-07-23 DIAGNOSIS — E119 Type 2 diabetes mellitus without complications: Secondary | ICD-10-CM | POA: Diagnosis not present

## 2023-07-23 DIAGNOSIS — I10 Essential (primary) hypertension: Secondary | ICD-10-CM | POA: Diagnosis not present

## 2023-07-23 DIAGNOSIS — Z1331 Encounter for screening for depression: Secondary | ICD-10-CM | POA: Diagnosis not present

## 2023-08-17 ENCOUNTER — Ambulatory Visit
Admission: RE | Admit: 2023-08-17 | Discharge: 2023-08-17 | Disposition: A | Discharge status: Home / Self Care | Payer: PPO | Source: Ambulatory Visit | Attending: Family Medicine | Admitting: Family Medicine

## 2023-08-17 DIAGNOSIS — M8588 Other specified disorders of bone density and structure, other site: Secondary | ICD-10-CM | POA: Diagnosis not present

## 2023-08-17 DIAGNOSIS — E2839 Other primary ovarian failure: Secondary | ICD-10-CM

## 2023-08-17 DIAGNOSIS — N958 Other specified menopausal and perimenopausal disorders: Secondary | ICD-10-CM | POA: Diagnosis not present

## 2023-09-18 DIAGNOSIS — I10 Essential (primary) hypertension: Secondary | ICD-10-CM | POA: Diagnosis not present

## 2023-09-18 DIAGNOSIS — E119 Type 2 diabetes mellitus without complications: Secondary | ICD-10-CM | POA: Diagnosis not present

## 2023-09-18 DIAGNOSIS — E1169 Type 2 diabetes mellitus with other specified complication: Secondary | ICD-10-CM | POA: Diagnosis not present

## 2023-09-28 DIAGNOSIS — E119 Type 2 diabetes mellitus without complications: Secondary | ICD-10-CM | POA: Diagnosis not present

## 2023-09-28 DIAGNOSIS — I10 Essential (primary) hypertension: Secondary | ICD-10-CM | POA: Diagnosis not present

## 2023-09-28 DIAGNOSIS — E1169 Type 2 diabetes mellitus with other specified complication: Secondary | ICD-10-CM | POA: Diagnosis not present

## 2023-10-17 DIAGNOSIS — E119 Type 2 diabetes mellitus without complications: Secondary | ICD-10-CM | POA: Diagnosis not present

## 2023-10-17 DIAGNOSIS — E1169 Type 2 diabetes mellitus with other specified complication: Secondary | ICD-10-CM | POA: Diagnosis not present

## 2023-10-17 DIAGNOSIS — I10 Essential (primary) hypertension: Secondary | ICD-10-CM | POA: Diagnosis not present

## 2023-11-02 ENCOUNTER — Other Ambulatory Visit: Payer: Self-pay | Admitting: Family Medicine

## 2023-11-02 DIAGNOSIS — Z1231 Encounter for screening mammogram for malignant neoplasm of breast: Secondary | ICD-10-CM

## 2023-11-16 DIAGNOSIS — I10 Essential (primary) hypertension: Secondary | ICD-10-CM | POA: Diagnosis not present

## 2023-11-16 DIAGNOSIS — E1169 Type 2 diabetes mellitus with other specified complication: Secondary | ICD-10-CM | POA: Diagnosis not present

## 2023-11-16 DIAGNOSIS — E119 Type 2 diabetes mellitus without complications: Secondary | ICD-10-CM | POA: Diagnosis not present

## 2023-11-29 DIAGNOSIS — E119 Type 2 diabetes mellitus without complications: Secondary | ICD-10-CM | POA: Diagnosis not present

## 2023-11-29 DIAGNOSIS — I10 Essential (primary) hypertension: Secondary | ICD-10-CM | POA: Diagnosis not present

## 2023-11-29 DIAGNOSIS — E1169 Type 2 diabetes mellitus with other specified complication: Secondary | ICD-10-CM | POA: Diagnosis not present

## 2023-12-16 DIAGNOSIS — I10 Essential (primary) hypertension: Secondary | ICD-10-CM | POA: Diagnosis not present

## 2023-12-16 DIAGNOSIS — E1169 Type 2 diabetes mellitus with other specified complication: Secondary | ICD-10-CM | POA: Diagnosis not present

## 2023-12-16 DIAGNOSIS — E119 Type 2 diabetes mellitus without complications: Secondary | ICD-10-CM | POA: Diagnosis not present

## 2023-12-23 ENCOUNTER — Ambulatory Visit
Admission: RE | Admit: 2023-12-23 | Discharge: 2023-12-23 | Disposition: A | Discharge status: Home / Self Care | Source: Ambulatory Visit | Attending: Family Medicine | Admitting: Family Medicine

## 2023-12-23 DIAGNOSIS — Z1231 Encounter for screening mammogram for malignant neoplasm of breast: Secondary | ICD-10-CM

## 2023-12-29 DIAGNOSIS — E1169 Type 2 diabetes mellitus with other specified complication: Secondary | ICD-10-CM | POA: Diagnosis not present

## 2023-12-29 DIAGNOSIS — I10 Essential (primary) hypertension: Secondary | ICD-10-CM | POA: Diagnosis not present

## 2023-12-29 DIAGNOSIS — E119 Type 2 diabetes mellitus without complications: Secondary | ICD-10-CM | POA: Diagnosis not present

## 2024-01-15 ENCOUNTER — Other Ambulatory Visit: Payer: Self-pay | Admitting: Medical Genetics

## 2024-01-15 DIAGNOSIS — I10 Essential (primary) hypertension: Secondary | ICD-10-CM | POA: Diagnosis not present

## 2024-01-15 DIAGNOSIS — E119 Type 2 diabetes mellitus without complications: Secondary | ICD-10-CM | POA: Diagnosis not present

## 2024-01-15 DIAGNOSIS — Z006 Encounter for examination for normal comparison and control in clinical research program: Secondary | ICD-10-CM

## 2024-01-15 DIAGNOSIS — E1169 Type 2 diabetes mellitus with other specified complication: Secondary | ICD-10-CM | POA: Diagnosis not present

## 2024-01-25 DIAGNOSIS — I10 Essential (primary) hypertension: Secondary | ICD-10-CM | POA: Diagnosis not present

## 2024-01-25 DIAGNOSIS — E1169 Type 2 diabetes mellitus with other specified complication: Secondary | ICD-10-CM | POA: Diagnosis not present

## 2024-01-29 DIAGNOSIS — E1169 Type 2 diabetes mellitus with other specified complication: Secondary | ICD-10-CM | POA: Diagnosis not present

## 2024-01-29 DIAGNOSIS — I10 Essential (primary) hypertension: Secondary | ICD-10-CM | POA: Diagnosis not present

## 2024-01-29 DIAGNOSIS — E119 Type 2 diabetes mellitus without complications: Secondary | ICD-10-CM | POA: Diagnosis not present

## 2024-02-14 DIAGNOSIS — E1169 Type 2 diabetes mellitus with other specified complication: Secondary | ICD-10-CM | POA: Diagnosis not present

## 2024-02-14 DIAGNOSIS — E119 Type 2 diabetes mellitus without complications: Secondary | ICD-10-CM | POA: Diagnosis not present

## 2024-02-14 DIAGNOSIS — I10 Essential (primary) hypertension: Secondary | ICD-10-CM | POA: Diagnosis not present

## 2024-02-22 LAB — GENECONNECT MOLECULAR SCREEN: Genetic Analysis Overall Interpretation: NEGATIVE
